# Patient Record
Sex: Female | Born: 1935 | Race: White | Hispanic: No | Smoking: Never smoker
Health system: Southern US, Community
[De-identification: ages and names within clinical notes are randomized; demographics above are authoritative.]

## PROBLEM LIST (undated history)

## (undated) DIAGNOSIS — E119 Type 2 diabetes mellitus without complications: Secondary | ICD-10-CM

## (undated) DIAGNOSIS — I4891 Unspecified atrial fibrillation: Secondary | ICD-10-CM

## (undated) HISTORY — PX: ORIF PELVIC FRACTURE: SUR948

## (undated) HISTORY — PX: WRIST FRACTURE SURGERY: SHX121

## (undated) HISTORY — PX: ABDOMINAL HYSTERECTOMY: SHX81

## (undated) HISTORY — PX: ANKLE FRACTURE SURGERY: SHX122

---

## 2014-07-19 ENCOUNTER — Emergency Department (HOSPITAL_BASED_OUTPATIENT_CLINIC_OR_DEPARTMENT_OTHER): Payer: Medicare Other

## 2014-07-19 ENCOUNTER — Encounter (HOSPITAL_BASED_OUTPATIENT_CLINIC_OR_DEPARTMENT_OTHER): Payer: Self-pay | Admitting: *Deleted

## 2014-07-19 ENCOUNTER — Emergency Department (HOSPITAL_BASED_OUTPATIENT_CLINIC_OR_DEPARTMENT_OTHER)
Admission: EM | Admit: 2014-07-19 | Discharge: 2014-07-19 | Disposition: A | Discharge status: Home / Self Care | Payer: Medicare Other | Attending: Emergency Medicine | Admitting: Emergency Medicine

## 2014-07-19 DIAGNOSIS — Z8679 Personal history of other diseases of the circulatory system: Secondary | ICD-10-CM | POA: Insufficient documentation

## 2014-07-19 DIAGNOSIS — Z79899 Other long term (current) drug therapy: Secondary | ICD-10-CM | POA: Diagnosis not present

## 2014-07-19 DIAGNOSIS — S59912A Unspecified injury of left forearm, initial encounter: Secondary | ICD-10-CM | POA: Insufficient documentation

## 2014-07-19 DIAGNOSIS — Y9289 Other specified places as the place of occurrence of the external cause: Secondary | ICD-10-CM | POA: Insufficient documentation

## 2014-07-19 DIAGNOSIS — Y9389 Activity, other specified: Secondary | ICD-10-CM | POA: Insufficient documentation

## 2014-07-19 DIAGNOSIS — W01198A Fall on same level from slipping, tripping and stumbling with subsequent striking against other object, initial encounter: Secondary | ICD-10-CM | POA: Insufficient documentation

## 2014-07-19 DIAGNOSIS — Z5189 Encounter for other specified aftercare: Secondary | ICD-10-CM

## 2014-07-19 DIAGNOSIS — Y998 Other external cause status: Secondary | ICD-10-CM | POA: Insufficient documentation

## 2014-07-19 DIAGNOSIS — T148XXA Other injury of unspecified body region, initial encounter: Secondary | ICD-10-CM

## 2014-07-19 DIAGNOSIS — Z7901 Long term (current) use of anticoagulants: Secondary | ICD-10-CM | POA: Diagnosis not present

## 2014-07-19 DIAGNOSIS — E119 Type 2 diabetes mellitus without complications: Secondary | ICD-10-CM | POA: Insufficient documentation

## 2014-07-19 HISTORY — DX: Type 2 diabetes mellitus without complications: E11.9

## 2014-07-19 HISTORY — DX: Unspecified atrial fibrillation: I48.91

## 2014-07-19 MED ORDER — KETOROLAC TROMETHAMINE 60 MG/2ML IM SOLN
60.0000 mg | Freq: Once | INTRAMUSCULAR | Status: AC
  Administered 2014-07-19: 60 mg via INTRAMUSCULAR

## 2014-07-19 MED ORDER — KETOROLAC TROMETHAMINE 60 MG/2ML IM SOLN
INTRAMUSCULAR | Status: AC
  Filled 2014-07-19: qty 2

## 2014-07-19 MED ORDER — CEPHALEXIN 500 MG PO CAPS: 500.0000 mg | ORAL_CAPSULE | Freq: Four times a day (QID) | ORAL | Status: AC

## 2014-07-19 MED ORDER — MELOXICAM 15 MG PO TABS: 15.0000 mg | ORAL_TABLET | Freq: Every day | ORAL | Status: AC

## 2014-07-19 MED ORDER — CEPHALEXIN 250 MG PO CAPS
ORAL_CAPSULE | ORAL | Status: AC
  Administered 2014-07-19: 500 mg
  Filled 2014-07-19: qty 2

## 2014-07-19 NOTE — ED Notes (Signed)
Pt. Reports she fell against a brick wall hitting the lower L arm on Sat. Morning.  Pt. Repots she is on a blood thinner and has had increased swelling since she was seen at urgent care on Sat.  Pt. Is still having bleeding from the site in the lower L arm at the injury site.

## 2014-07-19 NOTE — ED Provider Notes (Signed)
CSN: [id number]     Arrival date & time 07/19/14  0035 History   First MD Initiated Contact with Patient 07/19/14 0053     Chief Complaint  Patient presents with  . Arm Injury     (Consider location/radiation/quality/duration/timing/severity/associated sxs/prior Treatment) Patient is a 79 y.o. female presenting with arm injury. The history is provided by the patient.  Arm Injury Location:  Arm Injury: yes   Mechanism of injury comment:  Fall and scraped arm on brick wall causing skin tear bruising and hematoma Arm location:  L forearm Pain details:    Quality:  Aching   Radiates to:  Does not radiate   Severity:  Severe   Onset quality:  Sudden   Timing:  Constant   Progression:  Unchanged Chronicity:  New Dislocation: no   Foreign body present:  No foreign bodies Tetanus status:  Up to date Prior injury to area:  No Relieved by:  Nothing Worsened by:  Movement Ineffective treatments:  Acetaminophen (ultram) Associated symptoms: no back pain and no neck pain     Past Medical History  Diagnosis Date  . Atrial fibrillation   . Diabetes mellitus without complication    Past Surgical History  Procedure Laterality Date  . Abdominal hysterectomy    . Wrist fracture surgery    . Ankle fracture surgery    . Orif pelvic fracture     History reviewed. No pertinent family history. History  Substance Use Topics  . Smoking status: Never Smoker   . Smokeless tobacco: Not on file  . Alcohol Use: 0.6 oz/week    1 Glasses of wine per week   OB History    No data available     Review of Systems  Musculoskeletal: Positive for arthralgias. Negative for back pain and neck pain.  All other systems reviewed and are negative.     Allergies  Codeine  Home Medications   Prior to Admission medications   Medication Sig Start Date End Date Taking? Authorizing Provider  dofetilide (TIKOSYN) 125 MCG capsule Take 125 mcg by mouth 2 (two) times daily.   Yes Historical Provider,  MD  potassium chloride SA (K-DUR,KLOR-CON) 20 MEQ tablet Take 20 mEq by mouth 2 (two) times daily.   Yes Historical Provider, MD  rivaroxaban (XARELTO) 20 MG TABS tablet Take 20 mg by mouth daily with supper.   Yes Historical Provider, MD  valsartan (DIOVAN) 160 MG tablet Take 160 mg by mouth daily.   Yes Historical Provider, MD  cephALEXin (KEFLEX) 500 MG capsule Take 1 capsule (500 mg total) by mouth 4 (four) times daily. 07/19/14   Io Dieujuste, MD  meloxicam (MOBIC) 15 MG tablet Take 1 tablet (15 mg total) by mouth daily. Take with food 07/19/14   Tannis Burstein, MD   BP 144/106 mmHg  Pulse 82  Temp(Src) 98.4 F (36.9 C) (Oral)  Resp 20  Ht 5\' 9"  (1.753 m)  Wt 170 lb (77.111 kg)  BMI 25.09 kg/m2  SpO2 98% Physical Exam  Constitutional: She is oriented to person, place, and time. She appears well-developed and well-nourished. No distress.  HENT:  Head: Normocephalic and atraumatic. Head is without raccoon's eyes and without Battle's sign.  Right Ear: No hemotympanum.  Left Ear: No hemotympanum.  Mouth/Throat: Oropharynx is clear and moist.  Eyes: Conjunctivae and EOM are normal. Pupils are equal, round, and reactive to light.  Neck: Normal range of motion. Neck supple.  Cardiovascular: Normal rate, regular rhythm and intact distal pulses.  Pulmonary/Chest: Effort normal and breath sounds normal. No respiratory distress. She has no wheezes. She has no rales.  Abdominal: Soft. Bowel sounds are normal. There is no tenderness. There is no rebound and no guarding.  Musculoskeletal: Normal range of motion.       Left elbow: Normal.       Left wrist: Normal.       Left forearm: She exhibits no deformity.       Arms: All compartments are soft, no snuff box tenderness.    Neurological: She is alert and oriented to person, place, and time. She has normal reflexes.  Skin: Skin is warm and dry.  Psychiatric: She has a normal mood and affect.    ED Course  Procedures (including critical  care time) Labs Review Labs Reviewed - No data to display  Imaging Review Dg Elbow Complete Left  07/19/2014   CLINICAL DATA:  Slipped on wet grass on Saturday and hit the left arm on a brick. Left elbow pain. Initial encounter.  EXAM: LEFT ELBOW - COMPLETE 3+ VIEW  COMPARISON:  None.  FINDINGS: There is no evidence of fracture, dislocation, or joint effusion.  IMPRESSION: Negative.   Electronically Signed   By: Marnee Spring M.D.   On: 07/19/2014 02:41   Dg Forearm Left  07/19/2014   CLINICAL DATA:  Slipped on wet grass and bowel. Left arm hit brick. Left forearm laceration. Initial encounter.  EXAM: LEFT FOREARM - 2 VIEW  COMPARISON:  None.  FINDINGS: Soft tissue swelling is noted about the mid to distal forearm. There is no evidence of osseous disruption. Small rounded densities overlying the distal forearm are thought to be on the patient's skin. No radiopaque foreign bodies are seen.  This visualized joint spaces are preserved. Mild negative ulnar variance is noted. The carpal rows appear grossly intact, and demonstrate normal alignment. The elbow joint is grossly unremarkable. No elbow joint effusion is identified.  IMPRESSION: No evidence of osseous disruption. No radiopaque foreign bodies seen.   Electronically Signed   By: Roanna Raider M.D.   On: 07/19/2014 02:41   Dg Wrist Complete Left  07/19/2014   CLINICAL DATA:  Slipped on wet grass, with injury to left arm from hitting brick. Left wrist pain and bruising. Initial encounter.  EXAM: LEFT WRIST - COMPLETE 3+ VIEW  COMPARISON:  Left hand radiographs performed 07/16/2014  FINDINGS: There is no evidence of fracture or dislocation. The carpal rows are intact, and demonstrate normal alignment. The joint spaces are preserved. Chronic subcortical cystic change is noted at the ulnar styloid, and mild subcortical cystic change is suggested at the triquetrum and pisiform.  Soft tissue swelling is noted overlying the distal radius.  IMPRESSION: No  evidence of fracture or dislocation.   Electronically Signed   By: Roanna Raider M.D.   On: 07/19/2014 02:39     EKG Interpretation None      MDM   Final diagnoses:  Hematoma  Visit for wound check    Irrigated and cleansed thoroughly.  Meds given.  Combat gauze applied.  Will start keflex.  Follow up with your PMD for ongoing care.      Cy Blamer, MD 07/19/14 [phone]

## 2016-03-20 IMAGING — DX DG ELBOW COMPLETE 3+V*L*
4 series · 4 of 4 positions shown · non-contrast
Comparison: None.

CLINICAL DATA: Slipped on wet grass on [REDACTED] and hit the left
arm on a brick. Left elbow pain. Initial encounter.

EXAM:
LEFT ELBOW - COMPLETE 3+ VIEW

[elbow ap]
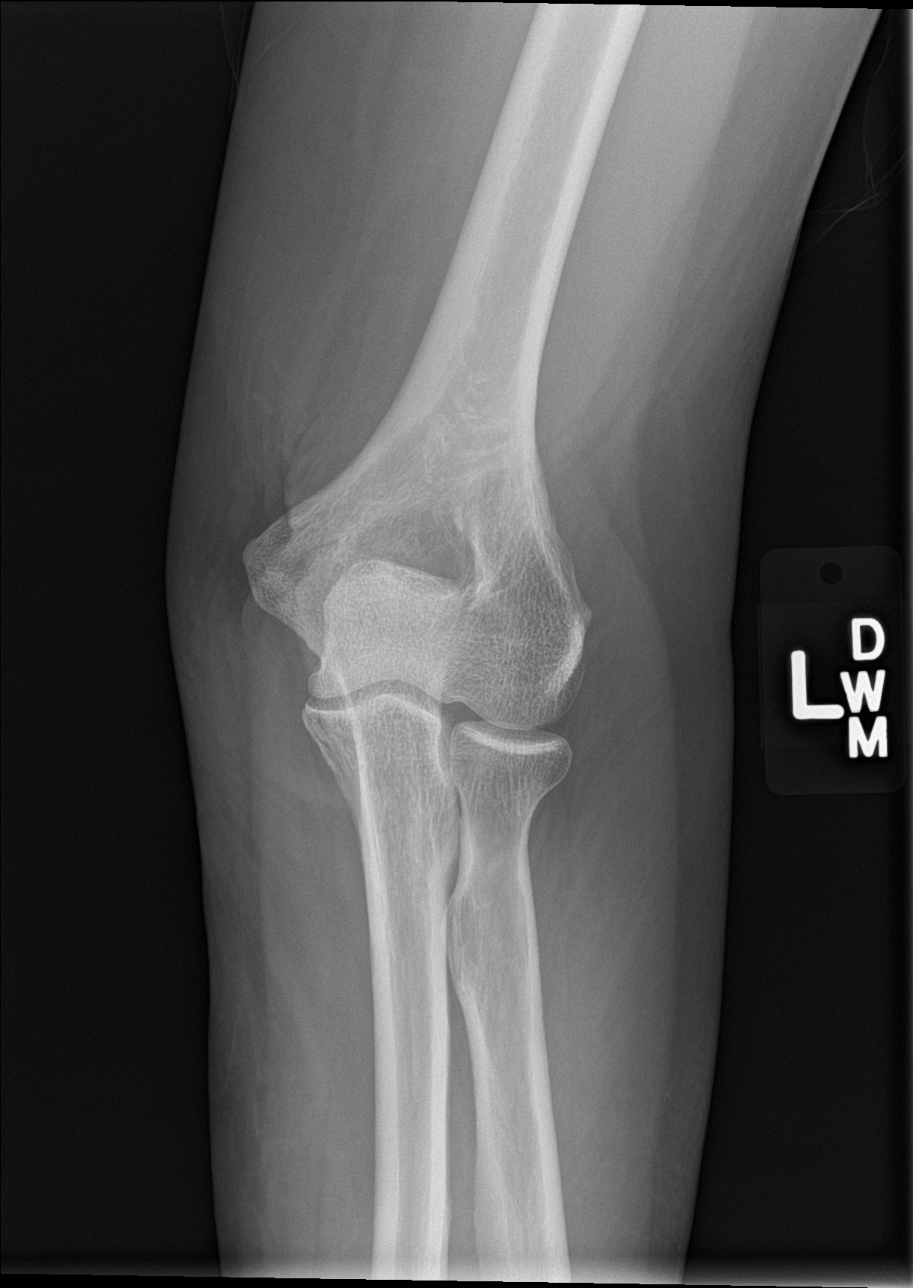

[elbow obl (1 of 2)]
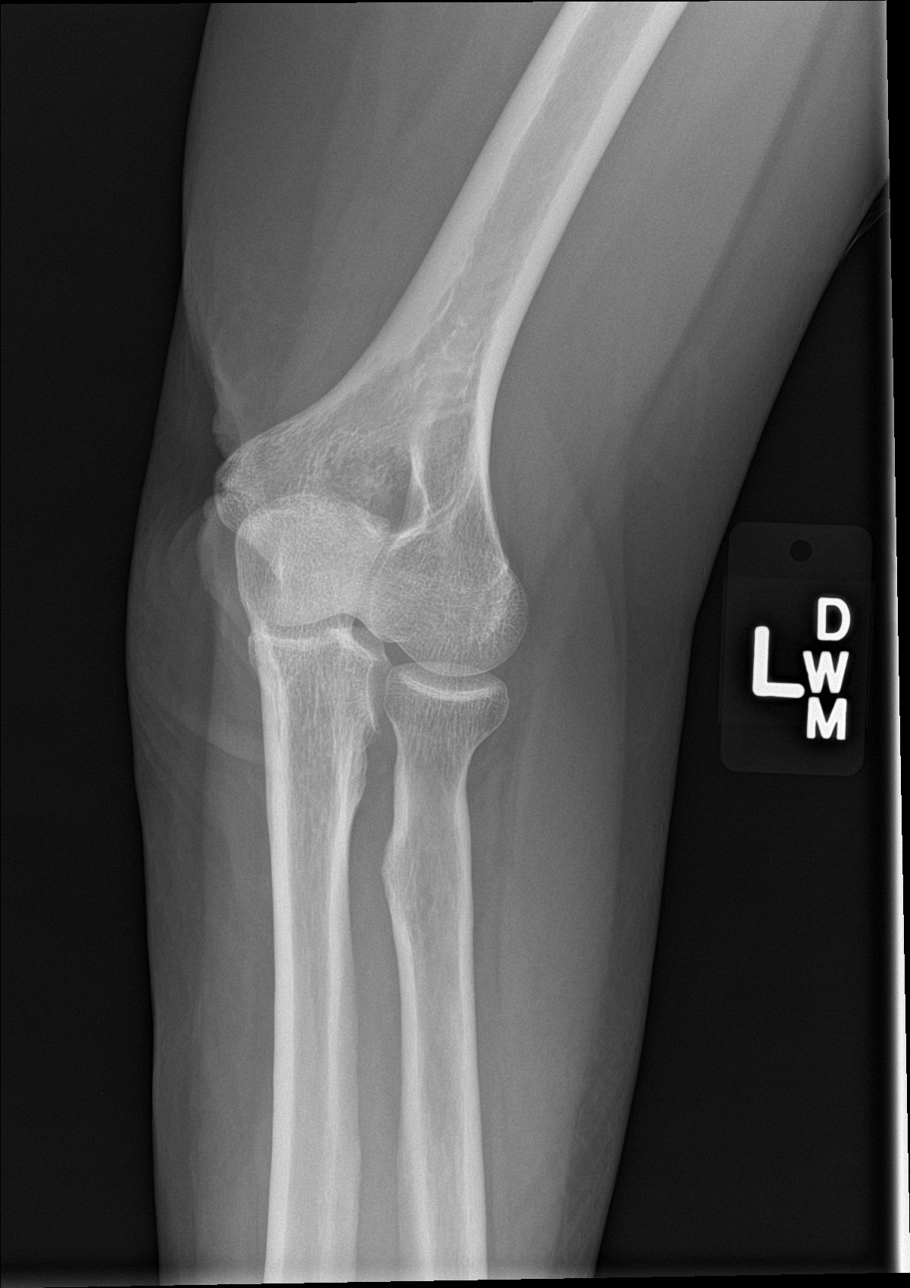

[elbow obl (2 of 2)]
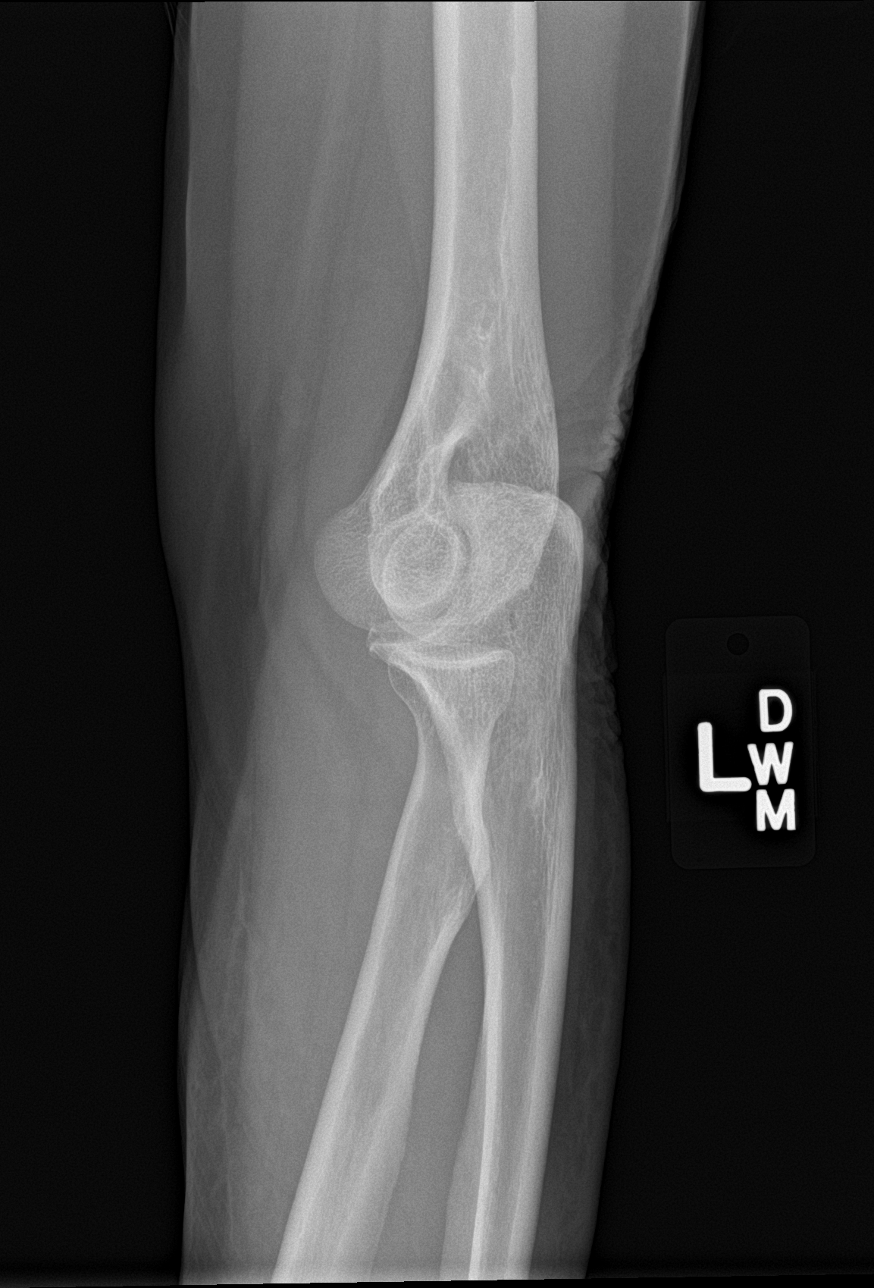

[elbow lat]
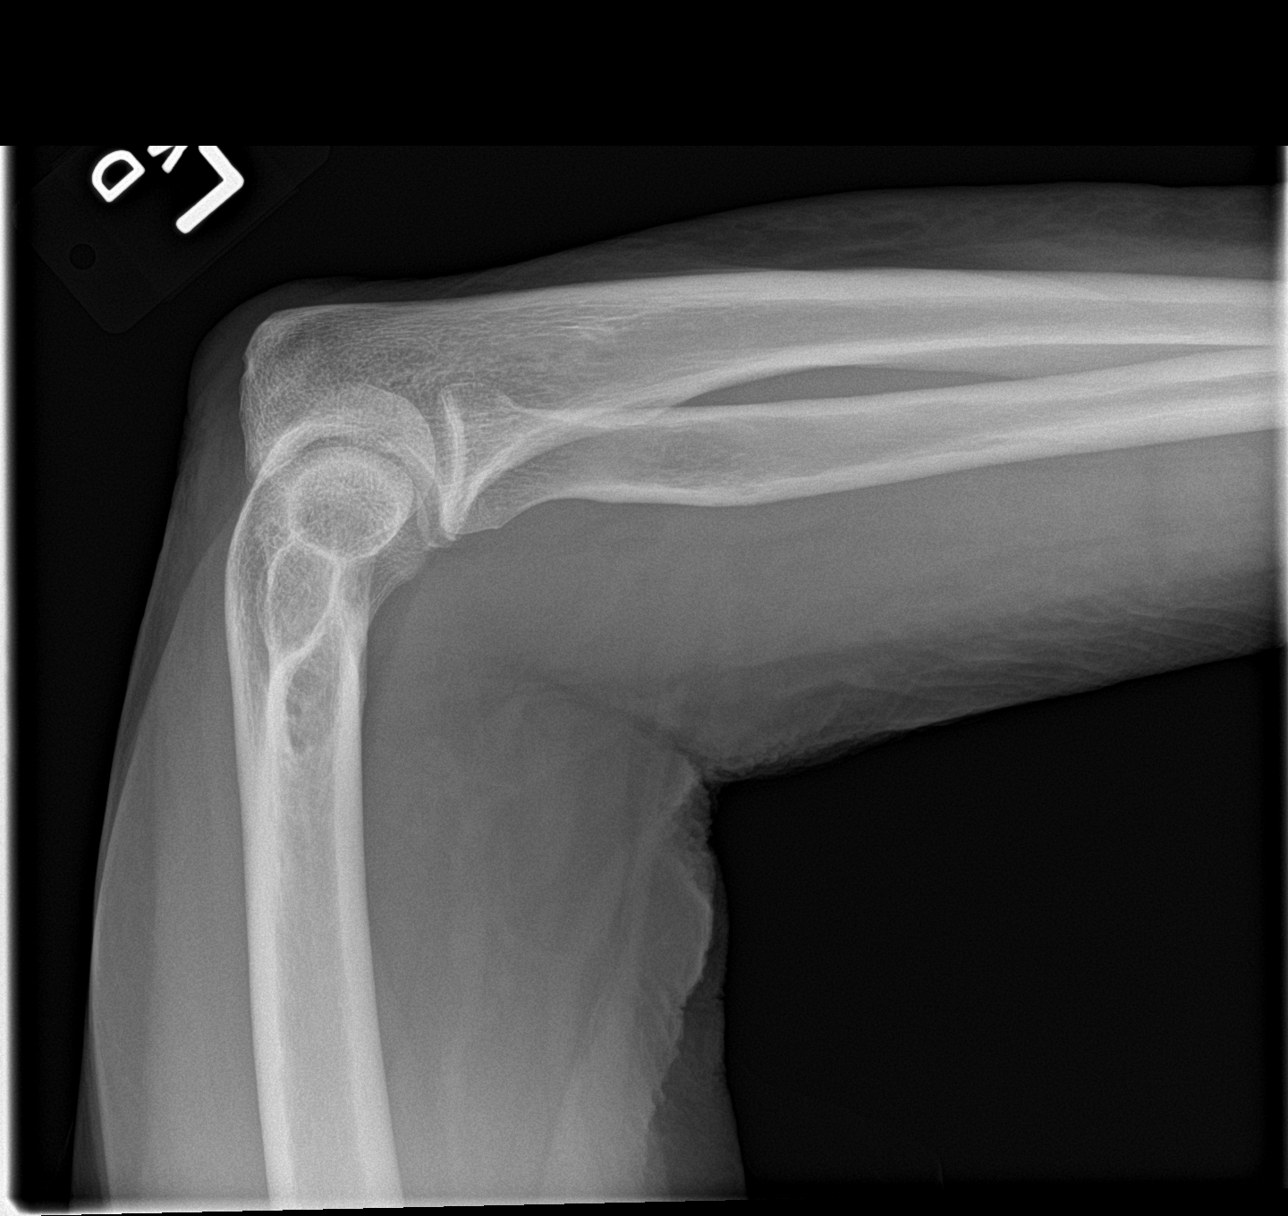

[4 of 4 positions shown; findings below may reference images not displayed]

FINDINGS: There is no evidence of fracture, dislocation, or joint effusion.
IMPRESSION: Negative.

## 2019-07-15 DEATH — deceased
